# Patient Record
Sex: Male | Born: 1970 | Race: White | Hispanic: Yes | Marital: Married
Health system: Southern US, Community
[De-identification: ages and names within clinical notes are randomized; demographics above are authoritative.]

## PROBLEM LIST (undated history)

## (undated) DIAGNOSIS — F1120 Opioid dependence, uncomplicated: Secondary | ICD-10-CM

---

## 2004-07-12 ENCOUNTER — Emergency Department (HOSPITAL_COMMUNITY): Admission: EM | Admit: 2004-07-12 | Discharge: 2004-07-12 | Payer: Self-pay | Admitting: Emergency Medicine

## 2005-10-04 ENCOUNTER — Emergency Department (HOSPITAL_COMMUNITY): Admission: EM | Admit: 2005-10-04 | Discharge: 2005-10-04 | Payer: Self-pay | Admitting: Emergency Medicine
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
## 2010-02-17 ENCOUNTER — Emergency Department (HOSPITAL_COMMUNITY): Admission: EM | Admit: 2010-02-17 | Discharge: 2010-02-17 | Payer: Self-pay | Admitting: Emergency Medicine

## 2010-10-05 ENCOUNTER — Emergency Department (HOSPITAL_COMMUNITY)
Admission: EM | Admit: 2010-10-05 | Discharge: 2010-10-05 | Payer: Self-pay | Source: Home / Self Care | Admitting: Emergency Medicine

## 2017-07-30 ENCOUNTER — Telehealth: Payer: Self-pay | Admitting: Cardiology

## 2017-07-30 NOTE — Telephone Encounter (Signed)
Close this encounterv

## 2023-04-15 ENCOUNTER — Ambulatory Visit (INDEPENDENT_AMBULATORY_CARE_PROVIDER_SITE_OTHER): Payer: Medicaid Other

## 2023-04-15 ENCOUNTER — Encounter (HOSPITAL_COMMUNITY): Payer: Self-pay | Admitting: Emergency Medicine

## 2023-04-15 ENCOUNTER — Ambulatory Visit (HOSPITAL_COMMUNITY)
Admission: EM | Admit: 2023-04-15 | Discharge: 2023-04-15 | Disposition: A | Discharge status: Home / Self Care | Payer: Medicaid Other | Attending: Emergency Medicine | Admitting: Emergency Medicine

## 2023-04-15 DIAGNOSIS — S93111A Dislocation of interphalangeal joint of right great toe, initial encounter: Secondary | ICD-10-CM

## 2023-04-15 MED ORDER — TRAMADOL HCL 50 MG PO TABS: 50.0000 mg | ORAL_TABLET | Freq: Four times a day (QID) | ORAL | 0 refills | Status: AC | PRN

## 2023-04-15 MED ORDER — KETOROLAC TROMETHAMINE 30 MG/ML IJ SOLN
INTRAMUSCULAR | Status: AC
  Filled 2023-04-15: qty 1

## 2023-04-15 MED ORDER — KETOROLAC TROMETHAMINE 30 MG/ML IJ SOLN
30.0000 mg | Freq: Once | INTRAMUSCULAR | Status: AC
  Administered 2023-04-15: 30 mg via INTRAMUSCULAR

## 2023-04-15 NOTE — Discharge Instructions (Addendum)
You have dislocated your toe, but did not break it Please call the podiatry clinic to make appointment for follow up. I have also placed a referral.  Pain should be controlled with ibuprofen and tylenol, alternated every 6 hours.  For severe or breakthrough pain, I have sent a few day supply of tramadol, which can be used every 6 hours. Please note this is a narcotic medication. It has addictive properties and should be used sparingly. It will make you drowsy. Do not drink, drive, or make judgement decisions if you choose to take this medication. I recommend to only use if needed for severe pain at night.   Apply ice to the shins to help with pain and tenderness. Elevate the foot to reduce swelling.

## 2023-04-15 NOTE — ED Triage Notes (Addendum)
Patient states he fell off of a ladder yesterday and is now having right big toe pain. Patient states his right foot got caught between the steps/extension.  Patient also has abrasions to bilateral shins.  Patient's visitor added that the patient 's bone is sticking out of the bottom of the toe  Patient states that he has been taking Tylenol for pain and the last dose was 1150 today.

## 2023-04-15 NOTE — ED Provider Notes (Signed)
[facility]    CSN: [id number] Arrival date & time: 04/15/23  1153     History   Chief Complaint Chief Complaint  Patient presents with   Toe Injury    HPI Jonathan Shields is a 52 y.o. male.  Presents with right foot injury that occurred yesterday. He fell off a ladder. The right foot was caught and now having great toe pain. Bilateral shins were scraped on the rungs as he fell.  Has tried tylenol, last dose about 2 hours ago is now wearing off.  Denies previous injury to this foot, has broken the great toe on left foot  History reviewed. No pertinent past medical history.  There are no problems to display for this patient.   History reviewed. No pertinent surgical history.   Home Medications    Prior to Admission medications   Medication Sig Start Date End Date Taking? Authorizing Provider  traMADol (ULTRAM) 50 MG tablet Take 1 tablet (50 mg total) by mouth every 6 (six) hours as needed for up to 5 days for severe pain. 04/15/23 04/20/23 Yes Siddiq Kaluzny, Ray Church    Family History History reviewed. No pertinent family history.  Social History     Allergies   Patient has no known allergies.   Review of Systems Review of Systems As per HPI  Physical Exam Triage Vital Signs ED Triage Vitals  Encounter Vitals Group     BP 04/15/23 1250 (!) 173/108     Systolic BP Percentile --      Diastolic BP Percentile --      Pulse Rate 04/15/23 1250 63     Resp 04/15/23 1250 16     Temp 04/15/23 1250 98 F (36.7 C)     Temp Source 04/15/23 1250 Oral     SpO2 04/15/23 1250 97 %     Weight --      Height --      Head Circumference --      Peak Flow --      Pain Score 04/15/23 1252 7     Pain Loc --      Pain Education --      Exclude from Growth Chart --    No data found.  Updated Vital Signs BP (!) 172/116 (BP Location: Left Arm)   Pulse 63   Temp 98 F (36.7 C) (Oral)   Resp 16   SpO2 97%    Physical Exam Vitals and nursing note  reviewed.  Constitutional:      General: He is not in acute distress. HENT:     Mouth/Throat:     Pharynx: Oropharynx is clear.  Cardiovascular:     Rate and Rhythm: Normal rate and regular rhythm.     Pulses: Normal pulses.  Pulmonary:     Effort: Pulmonary effort is normal.  Musculoskeletal:        General: Swelling, tenderness, deformity and signs of injury present.     Comments: Right great toe deformity, appears displaced dorsally. There is bruising and swelling. Tender to touch. There is a small cut at the base of toe ventrally Sensation intact distally, cap refill < 2 seconds. DP pulse 2+  Skin:    General: Skin is warm and dry.     Capillary Refill: Capillary refill takes less than 2 seconds.     Findings: Abrasion present.     Comments: Linear abrasions on bilateral shins. tender  Neurological:     Mental Status: He is alert and  oriented to person, place, and time.     UC Treatments / Results  Labs (all labs ordered are listed, but only abnormal results are displayed) Labs Reviewed - No data to display  EKG  Radiology DG Foot Complete Right  Result Date: 04/15/2023 CLINICAL DATA:  Trauma, fall EXAM: RIGHT FOOT COMPLETE - 3+ VIEW COMPARISON:  None Available. FINDINGS: The dislocation in the interphalangeal joint of big toe. No fracture lines are seen. Bony spurs are seen in first metatarsophalangeal joint. Small smoothly marginated calcifications adjacent to the lateral aspect of first metatarsophalangeal joint may be due to degenerative arthritis or old injury. IMPRESSION: Dorsal dislocation of the interphalangeal joint of right big toe. No recent fracture is seen. Degenerative changes are noted in first metatarsophalangeal joint. Electronically Signed   By: Ernie Avena M.D.   On: 04/15/2023 14:12    Procedures Procedures   Medications Ordered in UC Medications  ketorolac (TORADOL) 30 MG/ML injection 30 mg (30 mg Intramuscular Given 04/15/23 1328)     Initial Impression / Assessment and Plan / UC Course  I have reviewed the triage vital signs and the nursing notes.  Pertinent labs & imaging results that were available during my care of the patient were reviewed by me and considered in my medical decision making (see chart for details).  BP elevated although suspect related to pain  No prior labs available in chart. Patient denies history of kidney problems. Toradol IM given in clinic. Patient reports pain has improved.  Right foot xray initial read by this provider shows dislocation of the right great toe, distal phalanx displaced dorsally. Radiology reads no fracture. He will need to see podiatry, suspect soft tissue damage, possible tendon rupture given severity of dislocation. Urgent referral placed and provided with clinic contact info.  Discussed pain control with ibuprofen and tylenol. PDMP reviewed. Sent a few day supply of tramadol to use for breakthrough or severe pain. Discussed side effects and precautions of using narcotic medications. Patient verbalizes understanding.  Apply ice to shins and elevate extremities as needed. Return precautions discussed. Patient agrees to plan, all questions answered  Final Clinical Impressions(s) / UC Diagnoses   Final diagnoses:  Dislocation of interphalangeal joint of right great toe, initial encounter     Discharge Instructions      You have dislocated your toe, but did not break it Please call the podiatry clinic to make appointment for follow up. I have also placed a referral.  Pain should be controlled with ibuprofen and tylenol, alternated every 6 hours.  For severe or breakthrough pain, I have sent a few day supply of tramadol, which can be used every 6 hours. Please note this is a narcotic medication. It has addictive properties and should be used sparingly. It will make you drowsy. Do not drink, drive, or make judgement decisions if you choose to take this medication. I  recommend to only use if needed for severe pain at night.   Apply ice to the shins to help with pain and tenderness. Elevate the foot to reduce swelling.     ED Prescriptions     Medication Sig Dispense Auth. Provider   traMADol (ULTRAM) 50 MG tablet Take 1 tablet (50 mg total) by mouth every 6 (six) hours as needed for up to 5 days for severe pain. 15 tablet Nekayla Heider, Lurena Joiner, PA-C      I have reviewed the PDMP during this encounter.   Marlow Baars, [place] 04/15/23 1449

## 2023-04-18 ENCOUNTER — Ambulatory Visit: Payer: Medicaid Other | Admitting: Podiatry

## 2023-04-28 ENCOUNTER — Ambulatory Visit: Payer: Medicaid Other | Admitting: Podiatry

## 2023-10-02 ENCOUNTER — Emergency Department (HOSPITAL_BASED_OUTPATIENT_CLINIC_OR_DEPARTMENT_OTHER)
Admission: EM | Admit: 2023-10-02 | Discharge: 2023-10-02 | Disposition: A | Discharge status: Home / Self Care | Payer: Medicaid Other | Attending: Emergency Medicine | Admitting: Emergency Medicine

## 2023-10-02 ENCOUNTER — Emergency Department (HOSPITAL_BASED_OUTPATIENT_CLINIC_OR_DEPARTMENT_OTHER): Payer: Medicaid Other

## 2023-10-02 ENCOUNTER — Other Ambulatory Visit: Payer: Self-pay

## 2023-10-02 ENCOUNTER — Encounter (HOSPITAL_BASED_OUTPATIENT_CLINIC_OR_DEPARTMENT_OTHER): Payer: Self-pay

## 2023-10-02 DIAGNOSIS — Z79899 Other long term (current) drug therapy: Secondary | ICD-10-CM | POA: Diagnosis not present

## 2023-10-02 DIAGNOSIS — R42 Dizziness and giddiness: Secondary | ICD-10-CM | POA: Diagnosis present

## 2023-10-02 DIAGNOSIS — I10 Essential (primary) hypertension: Secondary | ICD-10-CM | POA: Diagnosis not present

## 2023-10-02 HISTORY — DX: Opioid dependence, uncomplicated: F11.20

## 2023-10-02 LAB — CBC WITH DIFFERENTIAL/PLATELET
Abs Immature Granulocytes: 0.01 10*3/uL (ref 0.00–0.07)
Basophils Absolute: 0.1 10*3/uL (ref 0.0–0.1)
Basophils Relative: 1 %
Eosinophils Absolute: 0.1 10*3/uL (ref 0.0–0.5)
Eosinophils Relative: 2 %
HCT: 44.5 % (ref 39.0–52.0)
Hemoglobin: 15 g/dL (ref 13.0–17.0)
Immature Granulocytes: 0 %
Lymphocytes Relative: 38 %
Lymphs Abs: 2.4 10*3/uL (ref 0.7–4.0)
MCH: 29.5 pg (ref 26.0–34.0)
MCHC: 33.7 g/dL (ref 30.0–36.0)
MCV: 87.4 fL (ref 80.0–100.0)
Monocytes Absolute: 0.7 10*3/uL (ref 0.1–1.0)
Monocytes Relative: 11 %
Neutro Abs: 3 10*3/uL (ref 1.7–7.7)
Neutrophils Relative %: 48 %
Platelets: 254 10*3/uL (ref 150–400)
RBC: 5.09 MIL/uL (ref 4.22–5.81)
RDW: 13.2 % (ref 11.5–15.5)
WBC: 6.3 10*3/uL (ref 4.0–10.5)
nRBC: 0 % (ref 0.0–0.2)

## 2023-10-02 LAB — COMPREHENSIVE METABOLIC PANEL
ALT: 35 U/L (ref 0–44)
AST: 25 U/L (ref 15–41)
Albumin: 4.3 g/dL (ref 3.5–5.0)
Alkaline Phosphatase: 69 U/L (ref 38–126)
Anion gap: 6 (ref 5–15)
BUN: 12 mg/dL (ref 6–20)
CO2: 28 mmol/L (ref 22–32)
Calcium: 9.4 mg/dL (ref 8.9–10.3)
Chloride: 103 mmol/L (ref 98–111)
Creatinine, Ser: 1.07 mg/dL (ref 0.61–1.24)
GFR, Estimated: >60 mL/min (ref >60)
Glucose, Bld: 112 mg/dL — ABNORMAL HIGH (ref 70–99)
Potassium: 4.6 mmol/L (ref 3.5–5.1)
Sodium: 137 mmol/L (ref 135–145)
Total Bilirubin: 0.6 mg/dL (ref 0.0–1.2)
Total Protein: 7.3 g/dL (ref 6.5–8.1)

## 2023-10-02 LAB — TROPONIN I (HIGH SENSITIVITY): Troponin I (High Sensitivity): 3 ng/L (ref <18)

## 2023-10-02 MED ORDER — AMLODIPINE BESYLATE 5 MG PO TABS: 5.0000 mg | ORAL_TABLET | Freq: Every day | ORAL | 2 refills | Status: AC

## 2023-10-02 MED ORDER — HYDRALAZINE HCL 25 MG PO TABS
25.0000 mg | ORAL_TABLET | Freq: Once | ORAL | Status: AC
  Administered 2023-10-02: 25 mg via ORAL
  Filled 2023-10-02: qty 1

## 2023-10-02 NOTE — ED Provider Notes (Signed)
 [facility] Provider Note   CSN: [id number] Arrival date & time: 10/02/23  1535     History  Chief Complaint  Patient presents with   Dizziness    Jonathan Shields is a 53 y.o. male with past medical history significant for opioid addiction who presents concern for dizziness, and blurred vision with elevated blood pressure.  He denies any chest pain, shortness of breath, nausea, vomiting, weakness, confusion.  He denies any decreased urine output.  No previous history of CKD.  He reports no drug use for 30 days.  No previous diagnosis of hypertension.   Dizziness      Home Medications Prior to Admission medications   Medication Sig Start Date End Date Taking? Authorizing Provider  amLODipine  (NORVASC ) 5 MG tablet Take 1 tablet (5 mg total) by mouth daily. 10/02/23 11/01/23 Yes Arvis Miguez H, PA-C      Allergies    Patient has no known allergies.    Review of Systems   Review of Systems  Neurological:  Positive for dizziness.  All other systems reviewed and are negative.   Physical Exam Updated Vital Signs BP (!) 177/118   Pulse 72   Temp 98.4 F (36.9 C) (Oral)   SpO2 99%  Physical Exam Vitals and nursing note reviewed.  Constitutional:      General: He is not in acute distress.    Appearance: Normal appearance.  HENT:     Head: Normocephalic and atraumatic.  Eyes:     General:        Right eye: No discharge.        Left eye: No discharge.  Cardiovascular:     Rate and Rhythm: Normal rate and regular rhythm.     Heart sounds: No murmur heard.    No friction rub. No gallop.  Pulmonary:     Effort: Pulmonary effort is normal.     Breath sounds: Normal breath sounds.  Abdominal:     General: Bowel sounds are normal.     Palpations: Abdomen is soft.  Skin:    General: Skin is warm and dry.     Capillary Refill: Capillary refill takes less than 2 seconds.  Neurological:     Mental Status: He is alert  and oriented to person, place, and time.     Comments: Moves all 4 limbs spontaneously, CN II through XII grossly intact, can ambulate without difficulty, intact sensation throughout.  Psychiatric:        Mood and Affect: Mood normal.        Behavior: Behavior normal.     ED Results / Procedures / Treatments   Labs (all labs ordered are listed, but only abnormal results are displayed) Labs Reviewed  COMPREHENSIVE METABOLIC PANEL - Abnormal; Notable for the following components:      Result Value   Glucose, Bld 112 (*)    All other components within normal limits  CBC WITH DIFFERENTIAL/PLATELET  TROPONIN I (HIGH SENSITIVITY)    EKG None  Radiology CT Head Wo Contrast Result Date: 10/02/2023 CLINICAL DATA:  Headache with dizziness and tunnel vision. EXAM: CT HEAD WITHOUT CONTRAST TECHNIQUE: Contiguous axial images were obtained from the base of the skull through the vertex without intravenous contrast. RADIATION DOSE REDUCTION: This exam was performed according to the departmental dose-optimization program which includes automated exposure control, adjustment of the mA and/or kV according to patient size and/or use of iterative reconstruction technique. COMPARISON:  None Available. FINDINGS: Brain:  No evidence of acute infarction, hemorrhage, hydrocephalus, extra-axial collection or mass lesion/mass effect. Vascular: No hyperdense vessel or unexpected calcification. Skull: Normal. Negative for fracture or focal lesion. Sinuses/Orbits: No acute finding. Other: None. IMPRESSION: No acute intracranial abnormality. Electronically Signed   By: Greig Pique M.D.   On: 10/02/2023 17:31    Procedures Procedures    Medications Ordered in ED Medications  hydrALAZINE  (APRESOLINE ) tablet 25 mg (25 mg Oral Given 10/02/23 1733)    ED Course/ Medical Decision Making/ A&P                                 Medical Decision Making Amount and/or Complexity of Data Reviewed Labs:  ordered.   Medical Decision Making:   Jonathan Shields is a 53 y.o. male who presented to the ED today with hypertension, dizziness, blurred vision detailed above.    Patient's presentation is complicated by their history of opioid addiction.  Patient placed on continuous vitals and telemetry monitoring while in ED which was reviewed periodically.  Complete initial physical exam performed, notably the patient  was hypertensive, BP 177/118, vital signs otherwise stable.  No focal neurologic deficits noted..    Reviewed and confirmed nursing documentation for past medical history, family history, social history.    Initial Assessment:   With the patient's presentation of elevated blood pressure readings, most likely diagnosis is hypertensive urgency. Other diagnoses associated with hypertensive emergency were considered including (but not limited to) intracranial hemorrhage, acute renal artery stenosis, acute kidney injury, myocardial stress, ophthalmologic emergencies. These are considered less likely due to history of present illness and physical exam findings.   This is most consistent with an acute life/limb threatening illness complicated by underlying chronic conditions. Will evaluate for hypertensive emergency as below.  Initial Plan:    Screening labs including CBC and Metabolic panel to evaluate for infectious or metabolic etiology of disease.  Given headache, eval for ICH with CTH EKG to evaluate for cardiac pathology.  Initial Study Results:   Laboratory  All laboratory results reviewed without evidence of clinically relevant pathology.   Exceptions include: Overall unremarkable lab work, notably with normal kidney function  EKG EKG was reviewed independently. Rate, rhythm, axis, intervals all examined and without medically relevant abnormality. ST segments without concerns for elevations.    Radiology:  All images reviewed independently. Agree with radiology report at this  time.   CT Head Wo Contrast Result Date: 10/02/2023 CLINICAL DATA:  Headache with dizziness and tunnel vision. EXAM: CT HEAD WITHOUT CONTRAST TECHNIQUE: Contiguous axial images were obtained from the base of the skull through the vertex without intravenous contrast. RADIATION DOSE REDUCTION: This exam was performed according to the departmental dose-optimization program which includes automated exposure control, adjustment of the mA and/or kV according to patient size and/or use of iterative reconstruction technique. COMPARISON:  None Available. FINDINGS: Brain: No evidence of acute infarction, hemorrhage, hydrocephalus, extra-axial collection or mass lesion/mass effect. Vascular: No hyperdense vessel or unexpected calcification. Skull: Normal. Negative for fracture or focal lesion. Sinuses/Orbits: No acute finding. Other: None. IMPRESSION: No acute intracranial abnormality. Electronically Signed   By: Greig Pique M.D.   On: 10/02/2023 17:31    Reassessment and Plan:   Administered one-time dose of hydralazine  to help lower his blood pressure in the ED, and will plan to discharge on amlodipine  5 mg with plan for close PCP follow-up for untreated poorly controlled hypertension.  On recheck  blood pressure 153/90.  Final Clinical Impression(s) / ED Diagnoses Final diagnoses:  Dizziness  Hypertension, unspecified type  Uncontrolled hypertension    Rx / DC Orders ED Discharge Orders          Ordered    amLODipine  (NORVASC ) 5 MG tablet  Daily        10/02/23 1822              Rosan Sherlean VEAR DEVONNA 10/02/23 TRENNA Armenta Canning, MD 10/02/23 2211

## 2023-10-02 NOTE — ED Notes (Signed)
 Patient transported to CT

## 2023-10-02 NOTE — ED Triage Notes (Signed)
 Pt states he has been having dizziness. Pt is at [facility] for treatment and while there they noted his BP to be elevated numerous times. Pt states he is having blurred vision. Pt has been drug free for 30 days.

## 2023-10-02 NOTE — Discharge Instructions (Signed)
